# Patient Record
Sex: Female | Born: 1975 | Race: Black or African American | Hispanic: No | Marital: Single | Smoking: Current every day smoker
Health system: Southern US, Community
[De-identification: ages and names within clinical notes are randomized; demographics above are authoritative.]

## PROBLEM LIST (undated history)

## (undated) HISTORY — PX: COSMETIC SURGERY: SHX468

## (undated) HISTORY — PX: SHOULDER SURGERY: SHX246

## (undated) HISTORY — PX: OTHER SURGICAL HISTORY: SHX169

## (undated) HISTORY — PX: FACIAL FRACTURE SURGERY: SHX1570

---

## 2001-06-23 ENCOUNTER — Emergency Department (HOSPITAL_COMMUNITY): Admission: EM | Admit: 2001-06-23 | Discharge: 2001-06-23 | Payer: Self-pay | Admitting: Emergency Medicine

## 2001-06-23 ENCOUNTER — Encounter: Payer: Self-pay | Admitting: Emergency Medicine

## 2001-07-04 ENCOUNTER — Emergency Department (HOSPITAL_COMMUNITY): Admission: EM | Admit: 2001-07-04 | Discharge: 2001-07-04 | Payer: Self-pay | Admitting: Emergency Medicine

## 2001-07-09 ENCOUNTER — Emergency Department (HOSPITAL_COMMUNITY): Admission: EM | Admit: 2001-07-09 | Discharge: 2001-07-09 | Payer: Self-pay | Admitting: Emergency Medicine

## 2001-07-27 ENCOUNTER — Emergency Department (HOSPITAL_COMMUNITY): Admission: EM | Admit: 2001-07-27 | Discharge: 2001-07-27 | Payer: Self-pay | Admitting: *Deleted

## 2001-11-29 ENCOUNTER — Other Ambulatory Visit: Admission: RE | Admit: 2001-11-29 | Discharge: 2001-11-29 | Payer: Self-pay | Admitting: Obstetrics and Gynecology

## 2002-03-19 ENCOUNTER — Inpatient Hospital Stay (HOSPITAL_COMMUNITY): Admission: AD | Admit: 2002-03-19 | Discharge: 2002-03-22 | Payer: Self-pay | Admitting: Obstetrics and Gynecology

## 2002-04-16 ENCOUNTER — Other Ambulatory Visit: Admission: RE | Admit: 2002-04-16 | Discharge: 2002-04-16 | Payer: Self-pay | Admitting: Obstetrics and Gynecology

## 2003-01-16 ENCOUNTER — Emergency Department (HOSPITAL_COMMUNITY): Admission: EM | Admit: 2003-01-16 | Discharge: 2003-01-16 | Payer: Self-pay | Admitting: Emergency Medicine

## 2003-01-20 ENCOUNTER — Inpatient Hospital Stay (HOSPITAL_COMMUNITY): Admission: AD | Admit: 2003-01-20 | Discharge: 2003-01-20 | Payer: Self-pay | Admitting: Obstetrics and Gynecology

## 2004-08-14 ENCOUNTER — Inpatient Hospital Stay (HOSPITAL_COMMUNITY): Admission: AD | Admit: 2004-08-14 | Discharge: 2004-08-14 | Payer: Self-pay | Admitting: *Deleted

## 2006-04-07 ENCOUNTER — Inpatient Hospital Stay (HOSPITAL_COMMUNITY): Admission: AD | Admit: 2006-04-07 | Discharge: 2006-04-07 | Payer: Self-pay | Admitting: Obstetrics & Gynecology

## 2006-11-11 ENCOUNTER — Emergency Department (HOSPITAL_COMMUNITY): Admission: EM | Admit: 2006-11-11 | Discharge: 2006-11-12 | Payer: Self-pay | Admitting: Emergency Medicine

## 2006-11-17 ENCOUNTER — Emergency Department (HOSPITAL_COMMUNITY): Admission: EM | Admit: 2006-11-17 | Discharge: 2006-11-17 | Payer: Self-pay | Admitting: Family Medicine

## 2007-05-01 IMAGING — CT CT ABDOMEN W/O CM
3 series · 16 of 36 positions shown, 19 images · IV contrast (agent unspecified)
Comparison: Abdominal ultrasound on the same date.

CLINICAL DATA: Right sided abdominal pain and back pain.  Negative abdominal ultrasound. 
 ABDOMEN CT WITHOUT CONTRAST:
TECHNIQUE: Multidetector CT imaging of the abdomen was performed following the standard protocol without IV contrast.
TECHNIQUE: Multidetector CT imaging of the pelvis was performed following the standard protocol without IV contrast.

[Series 2: renal stone · axial · 0.76mm/px · z∈[-446,-86]mm · 14 of 81 slices shown, 17 images]
[im 6/81  soft-tissue]
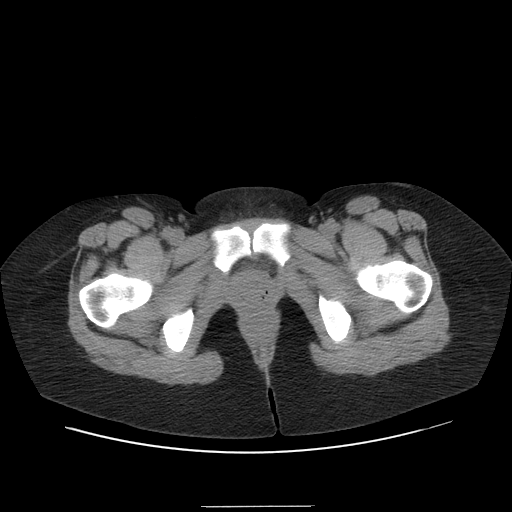
[im 6/81  bone]
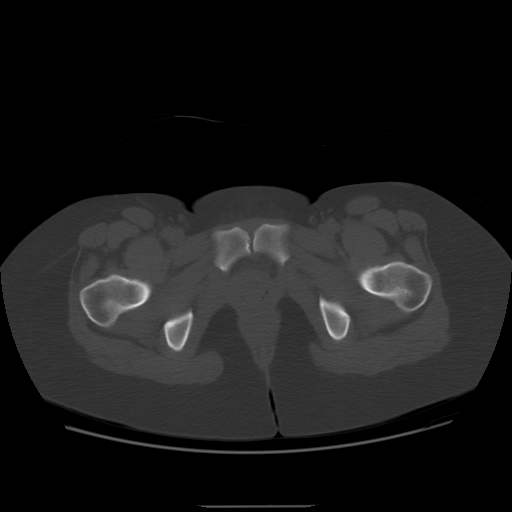
[im 13/81  soft-tissue]
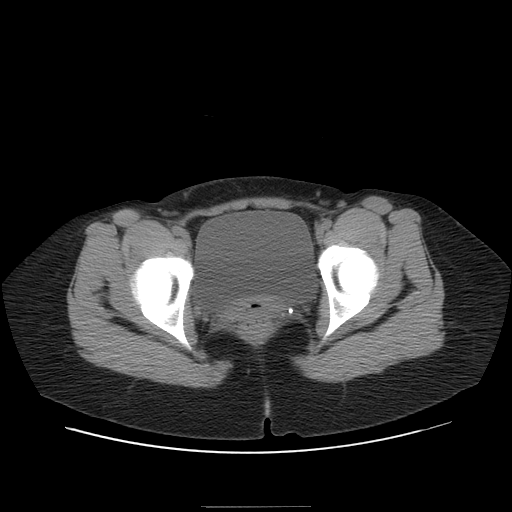
[im 19/81  soft-tissue]
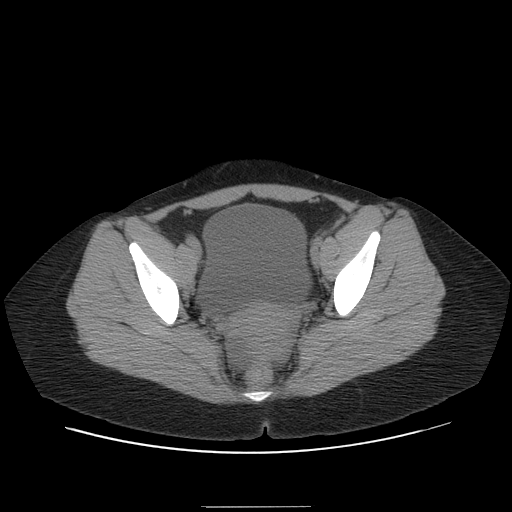
[im 26/81  soft-tissue]
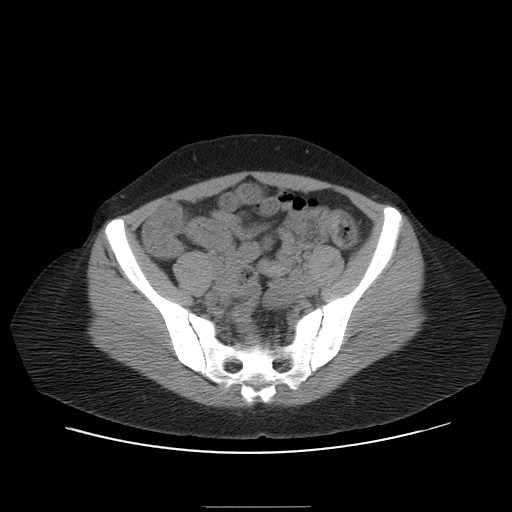
[im 34/81  soft-tissue]
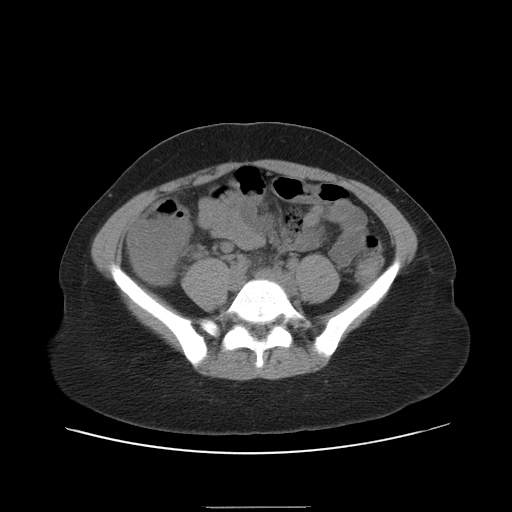
[im 42/81  soft-tissue]
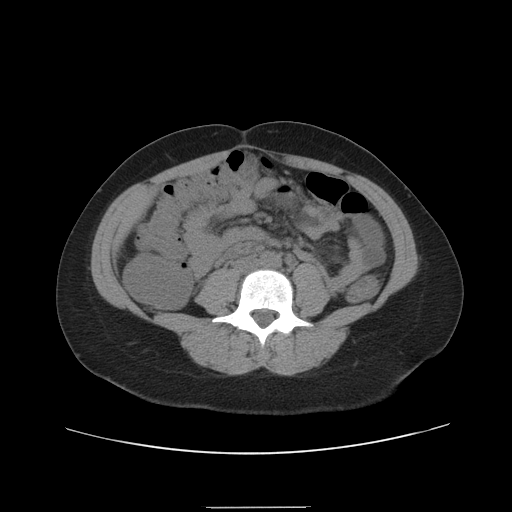
[im 47/81  soft-tissue]
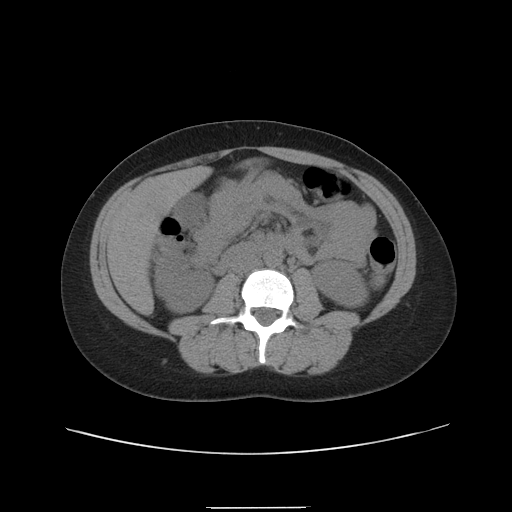
[im 55/81  soft-tissue]
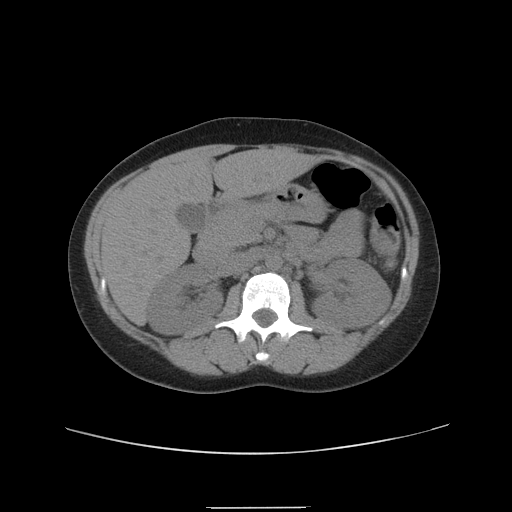
[im 62/81  soft-tissue]
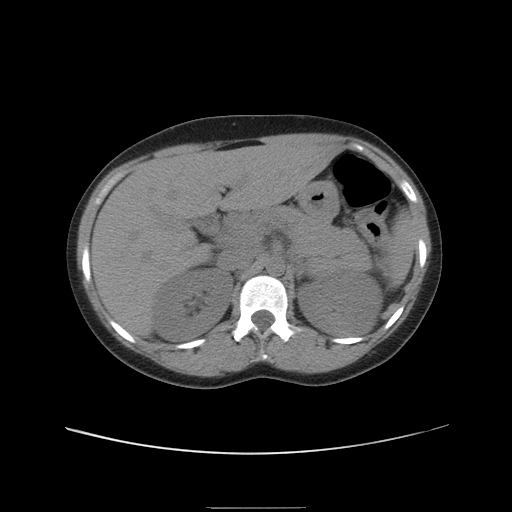
[im 62/81  bone]
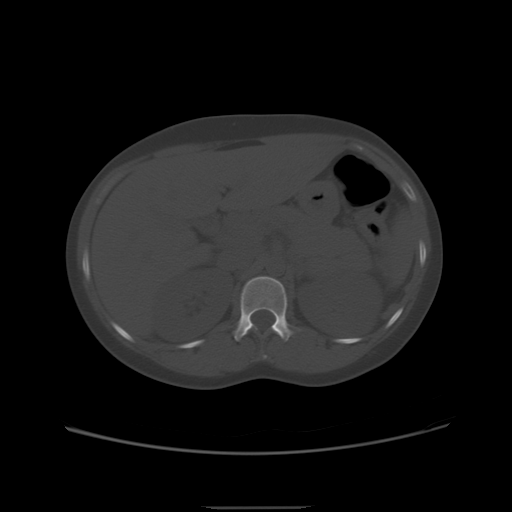
[im 68/81  soft-tissue]
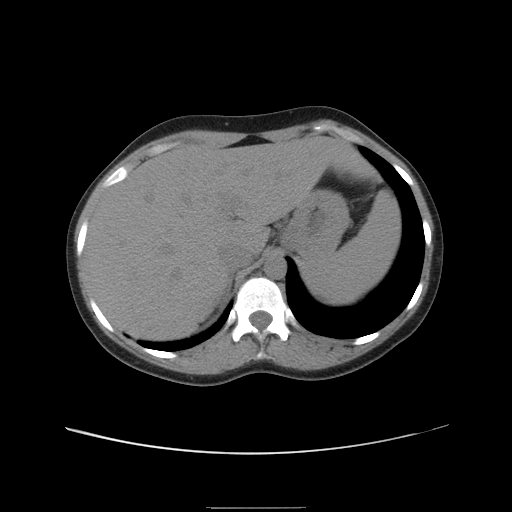
[im 70/81  lung]
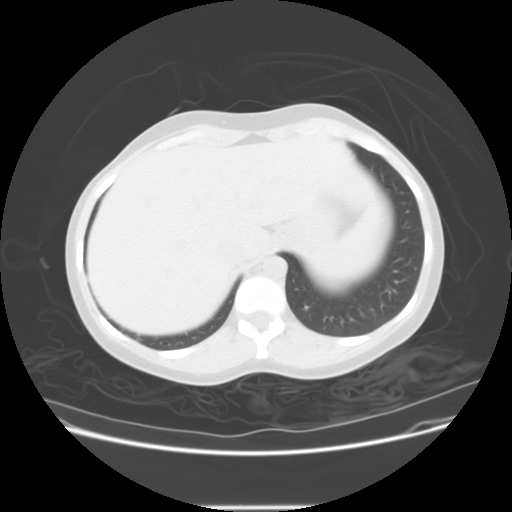
[im 73/81  lung]
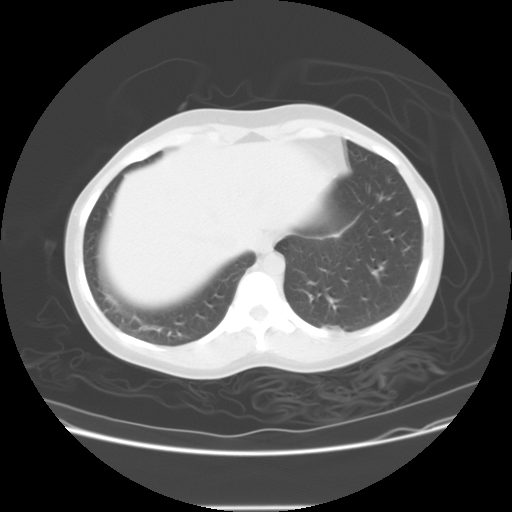
[im 75/81  soft-tissue]
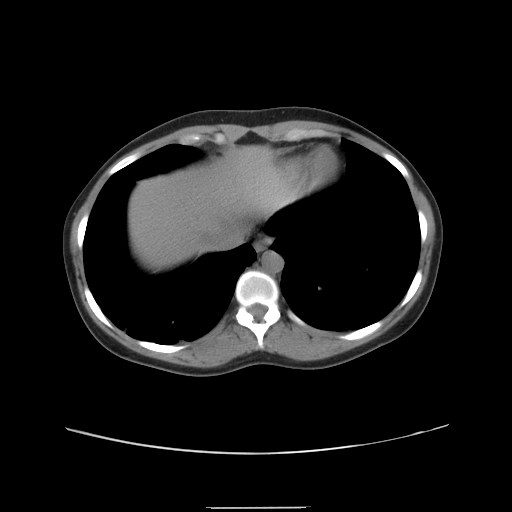
[im 75/81  lung]
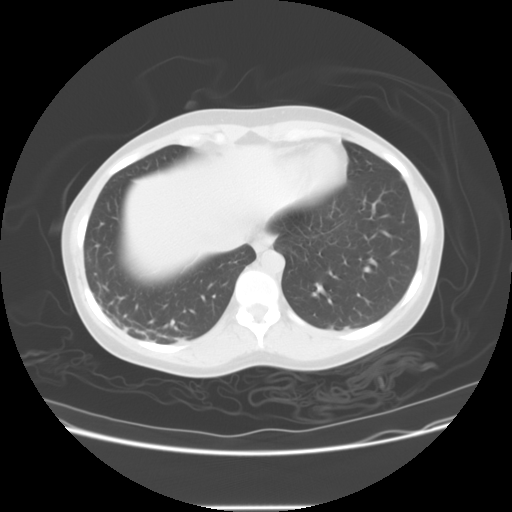
[im 78/81  lung]
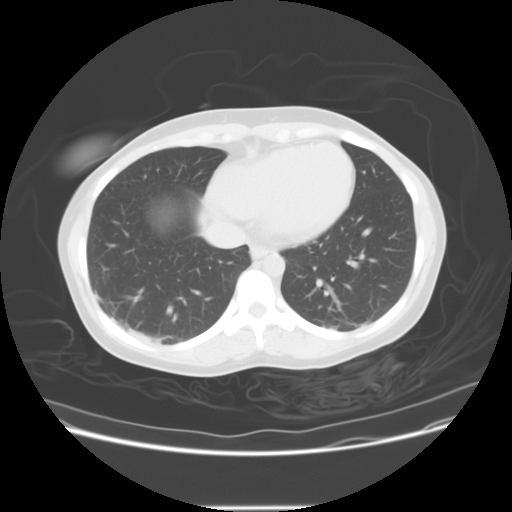

[Series 300: cor abd · coronal · 0.82mm/px · 1 of 90 slices shown]
[im 2/90  soft-tissue]
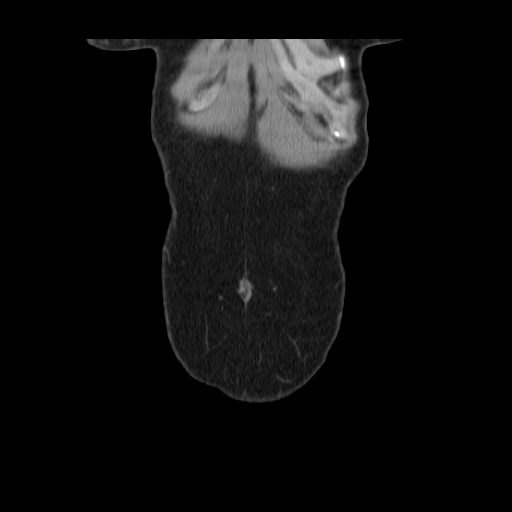

[Series 301: sag abd · sagittal · 0.82mm/px · 1 of 136 slices shown]
[im 68/136  soft-tissue]
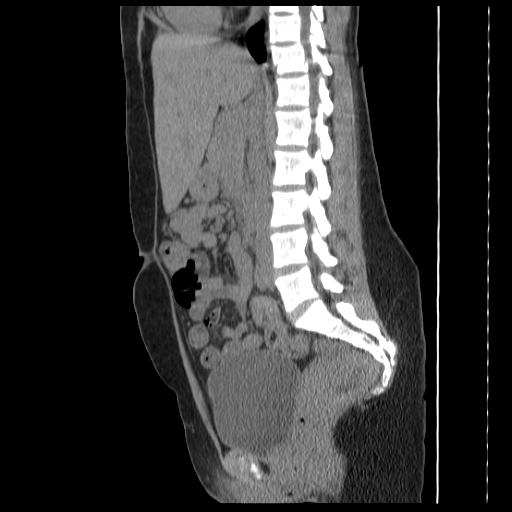

[16 of 36 positions shown; findings below may reference images not displayed]

FINDINGS: Minimal atelectasis present at the lung bases right greater than left.  No evidence of renal calculi, ureteral calculi, or obstruction in the abdomen.  The unenhanced solid organs are unremarkable.  There is some nonspecific fluid present in the colon and small bowel without overt bowel obstruction.  Fairly diffuse diverticula also noted in the right and transverse colons without evidence of surrounding inflammation to suggest definite acute diverticulitis.  No abscess, free fluid, or free air.
IMPRESSION: No definite acute findings in the abdomen.  Diverticulosis of the right and transverse colon without definite diverticulitis or bowel obstruction. 
 PELVIS CT WITHOUT CONTRAST:
FINDINGS: Small amount of free fluid is present in the cul-de-sac.  No ureteral or bladder calculi.  No hernias.  Uterus and adnexal regions are unremarkable by CT.  Pelvic bowel loops show no dilatation or inflammatory findings.
IMPRESSION: Small amount of free fluid in the cul-de-sac.  No acute inflammatory process in the pelvis.

## 2010-06-17 ENCOUNTER — Emergency Department (HOSPITAL_COMMUNITY): Admission: EM | Admit: 2010-06-17 | Discharge: 2010-06-17 | Payer: Self-pay | Admitting: Emergency Medicine

## 2010-10-10 ENCOUNTER — Emergency Department (HOSPITAL_COMMUNITY): Admission: EM | Admit: 2010-10-10 | Discharge: 2010-10-10 | Payer: Self-pay | Admitting: Emergency Medicine

## 2011-04-29 NOTE — Discharge Summary (Signed)
[facility]  Patient:    Wanda Cox, Wanda Cox Visit Number: [id number] MRN: [id number]          Service Type: OBS Location: 910A [id number] Attending Physician:  Wandalee Ferdinand Dictated by:   Rudy Jew Ashley Royalty, M.D. Admit Date:  03/19/2002 Discharge Date: 03/22/2002                             Discharge Summary  DISCHARGE DIAGNOSES:          1. Intrauterine pregnancy at 37 weeks,                                  delivered.                               2. Twin gestation--vertex/vertex.                               3. Intrauterine growth restriction--Twin A.                               4. Late presentation for care.                               5. Term birth, twin gestation, vertex/vertex.  OPERATIONS AND SPECIAL PROCEDURES:           OB delivery x 2.  CONSULTATIONS:                None.  DISCHARGE MEDICATIONS:        Chromagen.  HISTORY AND PHYSICAL:         This is a 35 year old, gravida 2, para 1, at [redacted] weeks gestation. Prenatal care was complicated by the aforementioned risk factors. Twin A was noted to have IUGR. Recent fetal surveillance was reassuring. The patient was admitted for induction secondary to IUGR of Twin A and favorable cervix with vertex/vertex presentation.  HOSPITAL COURSE:              The patient was admitted to [facility] [place]. Admission laboratory studies were drawn. On March 19, 2002 her labor was induced. She went on to deliver on March 20, 2002 at 4:43 a.m. Twin A was a 106-gram female with Apgars 3 at one minute, 8 at five minutes, sent to the neonatal intensive care unit. Team was present at approximately one minute after delivery. Arterial cord pH was 7.34. The umbilical cord was noted to be of quite narrow caliber. The second twin was born approximately 5:11 a.m. The second twin was a 5-pound 2-ounce female with Apgars 8 at one minute and 9 and five minutes, sent to the central nursery.  Arterial cord pH of the second twin was 7.31. Placenta and membranes were removed in their entirety and submitted intact, pathology for histologic studies.  The patients postpartum course was benign and she was discharged on the second postpartum day, afebrile and in satisfactory condition.  ACCESSORY CLINICAL FINDINGS:  Hemoglobin and hematocrit on admission were 9.9 and 30.2, respectively. Repeat values obtained on March 21, 2002 were 8.0 and 23.7, respectively. At the time of this dictation, I see no pathology report  from the placenta and membranes on the chart. This will be requested.  DISPOSITION:                  The patient is to return to [facility] in four to six weeks for postpartum evaluation. Dictated by:   Rudy Jew Ashley Royalty, M.D. Attending Physician:  Wandalee Ferdinand DD:  04/08/02 TD:  04/09/02 Job: [id number]

## 2013-04-12 ENCOUNTER — Encounter (HOSPITAL_COMMUNITY): Payer: Self-pay | Admitting: *Deleted

## 2013-04-12 ENCOUNTER — Emergency Department (HOSPITAL_COMMUNITY)
Admission: EM | Admit: 2013-04-12 | Discharge: 2013-04-12 | Disposition: A | Discharge status: Home / Self Care | Payer: BC Managed Care – PPO | Attending: Emergency Medicine | Admitting: Emergency Medicine

## 2013-04-12 DIAGNOSIS — L03019 Cellulitis of unspecified finger: Secondary | ICD-10-CM | POA: Insufficient documentation

## 2013-04-12 DIAGNOSIS — R209 Unspecified disturbances of skin sensation: Secondary | ICD-10-CM | POA: Insufficient documentation

## 2013-04-12 DIAGNOSIS — L03011 Cellulitis of right finger: Secondary | ICD-10-CM

## 2013-04-12 MED ORDER — SULFAMETHOXAZOLE-TRIMETHOPRIM 800-160 MG PO TABS: 1.0000 | ORAL_TABLET | Freq: Two times a day (BID) | ORAL | Status: AC

## 2013-04-12 MED ORDER — HYDROCODONE-ACETAMINOPHEN 5-325 MG PO TABS
2.0000 | ORAL_TABLET | Freq: Once | ORAL | Status: AC
  Administered 2013-04-12: 2 via ORAL
  Filled 2013-04-12: qty 2

## 2013-04-12 MED ORDER — HYDROCODONE-ACETAMINOPHEN 5-325 MG PO TABS: 1.0000 | ORAL_TABLET | Freq: Four times a day (QID) | ORAL | Status: DC | PRN

## 2013-04-12 NOTE — ED Provider Notes (Signed)
History     CSN: [id number]  Arrival date & time 04/12/13  0348   None     Chief Complaint  Patient presents with  . Joint Swelling    (Consider location/radiation/quality/duration/timing/severity/associated sxs/prior treatment) HPI Comments: Patient presents for swelling to her distal R index finger with onset yesterday. Patient admits to associated redness as well as a throbbing, nonradiating pain that is constant. Also endorses intermittent tingling sensation. Worse with palpation and improved with rest. Patient denies trying anything PO to help the pain. Denies fevers, drainage, numbness, and R hand/RUE weakness. Denies recent trauma or injury.  The history is provided by the patient. No language interpreter was used.    History reviewed. No pertinent past medical history.  History reviewed. No pertinent past surgical history.  No family history on file.  History  Substance Use Topics  . Smoking status: Never Smoker   . Smokeless tobacco: Not on file  . Alcohol Use: No    OB History   Grav Para Term Preterm Abortions TAB SAB Ect Mult Living                  Review of Systems  Constitutional: Negative for fever.  Musculoskeletal:       +finger swelling  Skin: Positive for color change.  Neurological: Negative for weakness and numbness.  All other systems reviewed and are negative.    Allergies  Review of patient's allergies indicates no known allergies.  Home Medications   Current Outpatient Rx  Name  Route  Sig  Dispense  Refill  . HYDROcodone-acetaminophen (NORCO/VICODIN) 5-325 MG per tablet   Oral   Take 1 tablet by mouth every 6 (six) hours as needed for pain.   6 tablet   0   . sulfamethoxazole-trimethoprim (BACTRIM DS,SEPTRA DS) 800-160 MG per tablet   Oral   Take 1 tablet by mouth 2 (two) times daily.   14 tablet   0     BP 113/84  Pulse 64  Temp(Src) 98.1 F (36.7 C) (Oral)  SpO2 100%  LMP 04/10/2013  Physical Exam  Nursing note  and vitals reviewed. Constitutional: She is oriented to person, place, and time. She appears well-developed and well-nourished.  HENT:  Head: Normocephalic and atraumatic.  Right Ear: External ear normal.  Left Ear: External ear normal.  Eyes: Conjunctivae and EOM are normal. No scleral icterus.  Neck: Normal range of motion.  Cardiovascular: Normal rate, regular rhythm and intact distal pulses.   Pulmonary/Chest: Effort normal. No respiratory distress.  Musculoskeletal: Normal range of motion. She exhibits tenderness.       Right hand: She exhibits tenderness and swelling. She exhibits normal range of motion, no bony tenderness, normal two-point discrimination, normal capillary refill and no deformity. Normal sensation noted. Normal strength noted. She exhibits no finger abduction and no thumb/finger opposition.       Hands: Erythema and swelling around nail bed of R index finger without decreased ROM or active drainage.  Neurovascularly intact.  Neurological: She is alert and oriented to person, place, and time. She has normal strength. No sensory deficit.  Skin: Skin is warm and dry. No rash noted. There is erythema. No pallor.  +mild erythema of skin surrounding nailbed of R index finger with mild heat-to-touch  Psychiatric: She has a normal mood and affect. Her behavior is normal.    ED Course  Procedures (including critical care time)  Labs Reviewed - No data to display No results found.   1.  Paronychia, right     MDM  Uncomplicated paronychia x 2 days. Patient afebrile and neurovascularly intact without pallor, pulselessness, paresthesias, or poikilothermia. Appropriate for d/c with PCP follow up, Bactrim for infection, and Norco to take as needed for severe pain. Indications for ED return discussed. Patient states comfort and understanding with this d/c plan with no unaddressed concerns.   Filed Vitals:   04/12/13 0357  BP: 113/84  Pulse: 64  Temp: 98.1 F (36.7 C)    TempSrc: Oral  SpO2: 100%           Antony Madura, PA-C 04/15/13 [phone]

## 2013-04-12 NOTE — ED Notes (Signed)
Rt. Index finger mildly swollen. Doesn't know why? It is. Feels numb, cap refill wnl.

## 2013-04-15 NOTE — ED Provider Notes (Signed)
Medical screening examination/treatment/procedure(s) were performed by non-physician practitioner and as supervising physician I was immediately available for consultation/collaboration.   Desera Graffeo L Tuff Clabo, MD 04/15/13 1919 

## 2015-04-17 ENCOUNTER — Emergency Department (HOSPITAL_COMMUNITY)
Admission: EM | Admit: 2015-04-17 | Discharge: 2015-04-17 | Disposition: A | Discharge status: Home / Self Care | Payer: Medicaid Other | Attending: Emergency Medicine | Admitting: Emergency Medicine

## 2015-04-17 ENCOUNTER — Encounter (HOSPITAL_COMMUNITY): Payer: Self-pay | Admitting: Emergency Medicine

## 2015-04-17 DIAGNOSIS — M62830 Muscle spasm of back: Secondary | ICD-10-CM | POA: Diagnosis not present

## 2015-04-17 DIAGNOSIS — Z720 Tobacco use: Secondary | ICD-10-CM | POA: Diagnosis not present

## 2015-04-17 DIAGNOSIS — Z3202 Encounter for pregnancy test, result negative: Secondary | ICD-10-CM | POA: Diagnosis not present

## 2015-04-17 DIAGNOSIS — M545 Low back pain: Secondary | ICD-10-CM | POA: Diagnosis present

## 2015-04-17 LAB — URINALYSIS, ROUTINE W REFLEX MICROSCOPIC
Bilirubin Urine: NEGATIVE
GLUCOSE, UA: NEGATIVE mg/dL
Hgb urine dipstick: NEGATIVE
KETONES UR: NEGATIVE mg/dL
LEUKOCYTES UA: NEGATIVE
NITRITE: NEGATIVE
PH: 8 (ref 5.0–8.0)
Protein, ur: NEGATIVE mg/dL
SPECIFIC GRAVITY, URINE: 1.023 (ref 1.005–1.030)
UROBILINOGEN UA: 0.2 mg/dL (ref 0.0–1.0)

## 2015-04-17 LAB — CBC WITH DIFFERENTIAL/PLATELET
BASOS ABS: 0 10*3/uL (ref 0.0–0.1)
BASOS PCT: 0 % (ref 0–1)
Eosinophils Absolute: 0.2 10*3/uL (ref 0.0–0.7)
Eosinophils Relative: 4 % (ref 0–5)
HEMATOCRIT: 36.6 % (ref 36.0–46.0)
HEMOGLOBIN: 11.8 g/dL — AB (ref 12.0–15.0)
Lymphocytes Relative: 43 % (ref 12–46)
Lymphs Abs: 2.4 10*3/uL (ref 0.7–4.0)
MCH: 27.4 pg (ref 26.0–34.0)
MCHC: 32.2 g/dL (ref 30.0–36.0)
MCV: 84.9 fL (ref 78.0–100.0)
MONOS PCT: 9 % (ref 3–12)
Monocytes Absolute: 0.5 10*3/uL (ref 0.1–1.0)
NEUTROS ABS: 2.4 10*3/uL (ref 1.7–7.7)
NEUTROS PCT: 44 % (ref 43–77)
Platelets: 244 10*3/uL (ref 150–400)
RBC: 4.31 MIL/uL (ref 3.87–5.11)
RDW: 14.4 % (ref 11.5–15.5)
WBC: 5.5 10*3/uL (ref 4.0–10.5)

## 2015-04-17 LAB — COMPREHENSIVE METABOLIC PANEL
ALBUMIN: 3.5 g/dL (ref 3.5–5.0)
ALK PHOS: 61 U/L (ref 38–126)
ALT: 13 U/L — ABNORMAL LOW (ref 14–54)
AST: 16 U/L (ref 15–41)
Anion gap: 8 (ref 5–15)
BILIRUBIN TOTAL: 0.4 mg/dL (ref 0.3–1.2)
BUN: 13 mg/dL (ref 6–20)
CHLORIDE: 107 mmol/L (ref 101–111)
CO2: 22 mmol/L (ref 22–32)
CREATININE: 0.95 mg/dL (ref 0.44–1.00)
Calcium: 9 mg/dL (ref 8.9–10.3)
GFR calc Af Amer: >60 mL/min (ref >60)
Glucose, Bld: 98 mg/dL (ref 70–99)
POTASSIUM: 4 mmol/L (ref 3.5–5.1)
Sodium: 137 mmol/L (ref 135–145)
Total Protein: 6.4 g/dL — ABNORMAL LOW (ref 6.5–8.1)

## 2015-04-17 LAB — LIPASE, BLOOD: LIPASE: 26 U/L (ref 22–51)

## 2015-04-17 LAB — POC URINE PREG, ED: PREG TEST UR: NEGATIVE

## 2015-04-17 MED ORDER — LIDOCAINE 5 % EX PTCH: 1.0000 | MEDICATED_PATCH | CUTANEOUS | Status: DC

## 2015-04-17 MED ORDER — CYCLOBENZAPRINE HCL 5 MG PO TABS: 5.0000 mg | ORAL_TABLET | Freq: Three times a day (TID) | ORAL | Status: DC | PRN

## 2015-04-17 MED ORDER — NAPROXEN 500 MG PO TABS: 500.0000 mg | ORAL_TABLET | Freq: Two times a day (BID) | ORAL | Status: DC

## 2015-04-17 MED ORDER — TRAMADOL HCL 50 MG PO TABS
50.0000 mg | ORAL_TABLET | Freq: Four times a day (QID) | ORAL | Status: DC | PRN
Start: 2015-04-17 — End: 2016-08-25
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
## 2015-04-17 NOTE — ED Notes (Signed)
C/o R lower back/flank pain that radiates to RLQ and down R leg since noon today.  Denies urinary symptoms.  Also reports nausea.

## 2015-04-17 NOTE — ED Notes (Signed)
T. Left with all belongings and refused wheelchair

## 2015-04-17 NOTE — Discharge Instructions (Signed)
SEEK IMMEDIATE MEDICAL ATTENTION IF: New numbness, tingling, weakness, or problem with the use of your arms or legs.  Severe back pain not relieved with medications.  Change in bowel or bladder control.  Increasing pain in any areas of the body (such as chest or abdominal pain).  Shortness of breath, dizziness or fainting.  Nausea (feeling sick to your stomach), vomiting, fever, or sweats.   Heat Therapy Heat therapy can help ease sore, stiff, injured, and tight muscles and joints. Heat relaxes your muscles, which may help ease your pain.  RISKS AND COMPLICATIONS If you have any of the following conditions, do not use heat therapy unless your health care provider has approved:  Poor circulation.  Healing wounds or scarred skin in the area being treated.  Diabetes, heart disease, or high blood pressure.  Not being able to feel (numbness) the area being treated.  Unusual swelling of the area being treated.  Active infections.  Blood clots.  Cancer.  Inability to communicate pain. This may include young children and people who have problems with their brain function (dementia).  Pregnancy. Heat therapy should only be used on old, pre-existing, or long-lasting (chronic) injuries. Do not use heat therapy on new injuries unless directed by your health care provider. HOW TO USE HEAT THERAPY There are several different kinds of heat therapy, including:  Moist heat pack.  Warm water bath.  Hot water bottle.  Electric heating pad.  Heated gel pack.  Heated wrap.  Electric heating pad. Use the heat therapy method suggested by your health care provider. Follow your health care provider's instructions on when and how to use heat therapy. GENERAL HEAT THERAPY RECOMMENDATIONS  Do not sleep while using heat therapy. Only use heat therapy while you are awake.  Your skin may turn pink while using heat therapy. Do not use heat therapy if your skin turns red.  Do not use heat  therapy if you have new pain.  High heat or long exposure to heat can cause burns. Be careful when using heat therapy to avoid burning your skin.  Do not use heat therapy on areas of your skin that are already irritated, such as with a rash or sunburn. SEEK MEDICAL CARE IF:  You have blisters, redness, swelling, or numbness.  You have new pain.  Your pain is worse. MAKE SURE YOU:  Understand these instructions.  Will watch your condition.  Will get help right away if you are not doing well or get worse. Document Released: 02/20/2012 Document Revised: 04/14/2014 Document Reviewed: 01/21/2014 [facility] Patient Information 2015 [place], [place]. This information is not intended to replace advice given to you by your health care provider. Make sure you discuss any questions you have with your health care provider.  Musculoskeletal Pain Musculoskeletal pain is muscle and boney aches and pains. These pains can occur in any part of the body. Your caregiver may treat you without knowing the cause of the pain. They may treat you if blood or urine tests, X-rays, and other tests were normal.  CAUSES There is often not a definite cause or reason for these pains. These pains may be caused by a type of germ (virus). The discomfort may also come from overuse. Overuse includes working out too hard when your body is not fit. Boney aches also come from weather changes. Bone is sensitive to atmospheric pressure changes. HOME CARE INSTRUCTIONS   Ask when your test results will be ready. Make sure you get your test results.  Only take over-the-counter or prescription medicines for pain, discomfort, or fever as directed by your caregiver. If you were given medications for your condition, do not drive, operate machinery or power tools, or sign legal documents for 24 hours. Do not drink alcohol. Do not take sleeping pills or other medications that may interfere with treatment.  Continue all activities unless  the activities cause more pain. When the pain lessens, slowly resume normal activities. Gradually increase the intensity and duration of the activities or exercise.  During periods of severe pain, bed rest may be helpful. Lay or sit in any position that is comfortable.  Putting ice on the injured area.  Put ice in a bag.  Place a towel between your skin and the bag.  Leave the ice on for 15 to 20 minutes, 3 to 4 times a day.  Follow up with your caregiver for continued problems and no reason can be found for the pain. If the pain becomes worse or does not go away, it may be necessary to repeat tests or do additional testing. Your caregiver may need to look further for a possible cause. SEEK IMMEDIATE MEDICAL CARE IF:  You have pain that is getting worse and is not relieved by medications.  You develop chest pain that is associated with shortness or breath, sweating, feeling sick to your stomach (nauseous), or throw up (vomit).  Your pain becomes localized to the abdomen.  You develop any new symptoms that seem different or that concern you. MAKE SURE YOU:   Understand these instructions.  Will watch your condition.  Will get help right away if you are not doing well or get worse. Document Released: 11/28/2005 Document Revised: 02/20/2012 Document Reviewed: 08/02/2013 [facility] Patient Information 2015 [employer] [place], [place]. This information is not intended to replace advice given to you by your health care provider. Make sure you discuss any questions you have with your health care provider.  Trigger Point Injection Trigger points are areas where you have muscle pain. A trigger point injection is a shot given in the trigger point to relieve that pain. A trigger point might feel like a knot in your muscle. It hurts to press on a trigger point. Sometimes the pain spreads out (radiates) to other parts of the body. For example, pressing on a trigger point in your shoulder might cause pain in  your arm or neck. You might have one trigger point. Or, you might have more than one. People often have trigger points in their upper back and lower back. They also occur often in the neck and shoulders. Pain from a trigger point lasts for a long time. It can make it hard to keep moving. You might not be able to do the exercise or physical therapy that could help you deal with the pain. A trigger point injection may help. It does not work for everyone. But, it may relieve your pain for a few days or a few months. A trigger point injection does not cure long-lasting (chronic) pain. LET YOUR CAREGIVER KNOW ABOUT:  Any allergies (especially to latex, lidocaine, or steroids).  Blood-thinning medicines that you take. These drugs can lead to bleeding or bruising after an injection. They include:  Aspirin.  Ibuprofen.  Clopidogrel.  Warfarin.  Other medicines you take. This includes all vitamins, herbs, eyedrops, over-the-counter medicines, and creams.  Use of steroids.  Recent infections.  Past problems with numbing medicines.  Bleeding problems.  Surgeries you have had.  Other health problems. RISKS AND  COMPLICATIONS A trigger point injection is a safe treatment. However, problems may develop, such as:  Minor side effects usually go away in 1 to 2 days. These may include:  Soreness.  Bruising.  Stiffness.  More serious problems are rare. But, they may include:  Bleeding under the skin (hematoma).  Skin infection.  Breaking off of the needle under your skin.  Lung puncture.  The trigger point injection may not work for you. BEFORE THE PROCEDURE You may need to stop taking any medicine that thins your blood. This is to prevent bleeding and bruising. Usually these medicines are stopped several days before the injection. No other preparation is needed. PROCEDURE  A trigger point injection can be given in your caregiver's office or in a clinic. Each injection takes 2  minutes or less.  Your caregiver will feel for trigger points. The caregiver may use a marker to circle the area for the injection.  The skin over the trigger point will be washed with a germ-killing (antiseptic) solution.  The caregiver pinches the spot for the injection.  Then, a very thin needle is used for the shot. You may feel pain or a twitching feeling when the needle enters the trigger point.  A numbing solution may be injected into the trigger point. Sometimes a drug to keep down swelling, redness, and warmth (inflammation) is also injected.  Your caregiver moves the needle around the trigger zone until the tightness and twitching goes away.  After the injection, your caregiver may put gentle pressure over the injection site.  Then it is covered with a bandage. AFTER THE PROCEDURE  You can go right home after the injection.  The bandage can be taken off after a few hours.  You may feel sore and stiff for 1 to 2 days.  Go back to your regular activities slowly. Your caregiver may ask you to stretch your muscles. Do not do anything that takes extra energy for a few days.  Follow your caregiver's instructions to manage and treat other pain. Document Released: 11/17/2011 Document Revised: 03/25/2013 Document Reviewed: 11/17/2011 [facility] Patient Information 2015 [place], [place]. This information is not intended to replace advice given to you by your health care provider. Make sure you discuss any questions you have with your health care provider.

## 2016-05-16 ENCOUNTER — Encounter (HOSPITAL_COMMUNITY): Payer: Self-pay | Admitting: *Deleted

## 2016-05-16 ENCOUNTER — Ambulatory Visit (HOSPITAL_COMMUNITY)
Admission: EM | Admit: 2016-05-16 | Discharge: 2016-05-16 | Disposition: A | Discharge status: Home / Self Care | Payer: Medicaid Other | Attending: Family Medicine | Admitting: Family Medicine

## 2016-05-16 DIAGNOSIS — J020 Streptococcal pharyngitis: Secondary | ICD-10-CM | POA: Diagnosis not present

## 2016-05-16 DIAGNOSIS — J029 Acute pharyngitis, unspecified: Secondary | ICD-10-CM | POA: Diagnosis present

## 2016-05-16 LAB — POCT RAPID STREP A: STREPTOCOCCUS, GROUP A SCREEN (DIRECT): NEGATIVE

## 2016-05-16 MED ORDER — AMOXICILLIN 500 MG PO CAPS: 500.0000 mg | ORAL_CAPSULE | Freq: Three times a day (TID) | ORAL | Status: DC
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
## 2016-05-16 NOTE — ED Notes (Signed)
Pt  Reports   Symptoms  Of  sorethroat     With     Pain  When  She  Swallows        X  4  Days      Pt  Has   An

## 2016-05-19 LAB — CULTURE, GROUP A STREP (THRC)

## 2016-07-17 ENCOUNTER — Emergency Department (HOSPITAL_COMMUNITY)
Admission: EM | Admit: 2016-07-17 | Discharge: 2016-07-17 | Disposition: A | Discharge status: Home / Self Care | Payer: Medicaid Other | Attending: Emergency Medicine | Admitting: Emergency Medicine

## 2016-07-17 ENCOUNTER — Emergency Department (HOSPITAL_COMMUNITY): Payer: Medicaid Other

## 2016-07-17 ENCOUNTER — Encounter (HOSPITAL_COMMUNITY): Payer: Self-pay | Admitting: *Deleted

## 2016-07-17 DIAGNOSIS — Y939 Activity, unspecified: Secondary | ICD-10-CM | POA: Insufficient documentation

## 2016-07-17 DIAGNOSIS — Y999 Unspecified external cause status: Secondary | ICD-10-CM | POA: Diagnosis not present

## 2016-07-17 DIAGNOSIS — M545 Low back pain, unspecified: Secondary | ICD-10-CM

## 2016-07-17 DIAGNOSIS — S0083XA Contusion of other part of head, initial encounter: Secondary | ICD-10-CM | POA: Insufficient documentation

## 2016-07-17 DIAGNOSIS — Z79899 Other long term (current) drug therapy: Secondary | ICD-10-CM | POA: Insufficient documentation

## 2016-07-17 DIAGNOSIS — F17200 Nicotine dependence, unspecified, uncomplicated: Secondary | ICD-10-CM | POA: Insufficient documentation

## 2016-07-17 DIAGNOSIS — Y92410 Unspecified street and highway as the place of occurrence of the external cause: Secondary | ICD-10-CM | POA: Insufficient documentation

## 2016-07-17 DIAGNOSIS — S0990XA Unspecified injury of head, initial encounter: Secondary | ICD-10-CM | POA: Diagnosis present

## 2016-07-17 LAB — RAPID URINE DRUG SCREEN, HOSP PERFORMED
Amphetamines: NOT DETECTED
BARBITURATES: NOT DETECTED
Benzodiazepines: NOT DETECTED
COCAINE: POSITIVE — AB
Opiates: NOT DETECTED
TETRAHYDROCANNABINOL: POSITIVE — AB

## 2016-07-17 LAB — URINALYSIS, ROUTINE W REFLEX MICROSCOPIC
BILIRUBIN URINE: NEGATIVE
GLUCOSE, UA: NEGATIVE mg/dL
HGB URINE DIPSTICK: NEGATIVE
KETONES UR: NEGATIVE mg/dL
Leukocytes, UA: NEGATIVE
Nitrite: NEGATIVE
PH: 5.5 (ref 5.0–8.0)
Protein, ur: NEGATIVE mg/dL
SPECIFIC GRAVITY, URINE: 1.011 (ref 1.005–1.030)

## 2016-07-17 LAB — POC URINE PREG, ED: Preg Test, Ur: NEGATIVE

## 2016-07-17 MED ORDER — ONDANSETRON HCL 4 MG/2ML IJ SOLN
4.0000 mg | Freq: Once | INTRAMUSCULAR | Status: AC
  Administered 2016-07-17: 4 mg via INTRAVENOUS
  Filled 2016-07-17: qty 2

## 2016-07-17 MED ORDER — NAPROXEN 500 MG PO TABS
500.0000 mg | ORAL_TABLET | Freq: Two times a day (BID) | ORAL | 0 refills | Status: DC
Start: 2016-07-17 — End: 2016-08-25

## 2016-07-17 MED ORDER — OXYCODONE-ACETAMINOPHEN 5-325 MG PO TABS: 1.0000 | ORAL_TABLET | Freq: Once | ORAL | Status: DC

## 2016-07-17 MED ORDER — IBUPROFEN 400 MG PO TABS
600.0000 mg | ORAL_TABLET | Freq: Once | ORAL | Status: AC
  Administered 2016-07-17: 600 mg via ORAL
  Filled 2016-07-17: qty 1

## 2016-07-17 MED ORDER — METHOCARBAMOL 500 MG PO TABS: 500.0000 mg | ORAL_TABLET | Freq: Two times a day (BID) | ORAL | 0 refills | Status: DC

## 2016-07-17 MED ORDER — HYDROCODONE-ACETAMINOPHEN 5-325 MG PO TABS: 1.0000 | ORAL_TABLET | Freq: Four times a day (QID) | ORAL | 0 refills | Status: DC | PRN

## 2016-07-17 MED ORDER — METHOCARBAMOL 500 MG PO TABS: 500.0000 mg | ORAL_TABLET | Freq: Once | ORAL | Status: DC

## 2016-07-17 MED ORDER — SODIUM CHLORIDE 0.9 % IV BOLUS (SEPSIS)
1000.0000 mL | Freq: Once | INTRAVENOUS | Status: AC
  Administered 2016-07-17: 1000 mL via INTRAVENOUS

## 2016-07-17 MED ORDER — FENTANYL CITRATE (PF) 100 MCG/2ML IJ SOLN
50.0000 ug | Freq: Once | INTRAMUSCULAR | Status: AC
  Administered 2016-07-17: 50 ug via INTRAVENOUS
  Filled 2016-07-17: qty 2

## 2016-07-17 NOTE — ED Notes (Signed)
Pt ambulated without difficulty to bathroom.
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
## 2016-07-17 NOTE — ED Notes (Signed)
Pt sleeping with snoring resp, will arouse to have bp taken, but goes back to sleep immediately,.

## 2016-08-24 ENCOUNTER — Emergency Department (HOSPITAL_COMMUNITY)
Admission: EM | Admit: 2016-08-24 | Discharge: 2016-08-25 | Disposition: A | Discharge status: Home / Self Care | Payer: Medicaid Other | Attending: Emergency Medicine | Admitting: Emergency Medicine

## 2016-08-24 ENCOUNTER — Emergency Department (HOSPITAL_COMMUNITY): Payer: Medicaid Other

## 2016-08-24 ENCOUNTER — Encounter (HOSPITAL_COMMUNITY): Payer: Self-pay | Admitting: *Deleted

## 2016-08-24 DIAGNOSIS — Y999 Unspecified external cause status: Secondary | ICD-10-CM | POA: Insufficient documentation

## 2016-08-24 DIAGNOSIS — F17200 Nicotine dependence, unspecified, uncomplicated: Secondary | ICD-10-CM | POA: Diagnosis not present

## 2016-08-24 DIAGNOSIS — S3992XA Unspecified injury of lower back, initial encounter: Secondary | ICD-10-CM | POA: Diagnosis present

## 2016-08-24 DIAGNOSIS — Y92410 Unspecified street and highway as the place of occurrence of the external cause: Secondary | ICD-10-CM | POA: Diagnosis not present

## 2016-08-24 DIAGNOSIS — S39012A Strain of muscle, fascia and tendon of lower back, initial encounter: Secondary | ICD-10-CM | POA: Diagnosis not present

## 2016-08-24 DIAGNOSIS — R0789 Other chest pain: Secondary | ICD-10-CM | POA: Diagnosis not present

## 2016-08-24 DIAGNOSIS — Y939 Activity, unspecified: Secondary | ICD-10-CM | POA: Insufficient documentation

## 2016-08-24 DIAGNOSIS — T148XXA Other injury of unspecified body region, initial encounter: Secondary | ICD-10-CM

## 2016-08-24 LAB — CBC
HCT: 44.8 % (ref 36.0–46.0)
Hemoglobin: 14.2 g/dL (ref 12.0–15.0)
MCH: 28.6 pg (ref 26.0–34.0)
MCHC: 31.7 g/dL (ref 30.0–36.0)
MCV: 90.3 fL (ref 78.0–100.0)
Platelets: 283 10*3/uL (ref 150–400)
RBC: 4.96 MIL/uL (ref 3.87–5.11)
RDW: 15 % (ref 11.5–15.5)
WBC: 9 10*3/uL (ref 4.0–10.5)

## 2016-08-24 LAB — BASIC METABOLIC PANEL
Anion gap: 7 (ref 5–15)
BUN: 6 mg/dL (ref 6–20)
CO2: 22 mmol/L (ref 22–32)
Calcium: 8.8 mg/dL — ABNORMAL LOW (ref 8.9–10.3)
Chloride: 109 mmol/L (ref 101–111)
Creatinine, Ser: 0.8 mg/dL (ref 0.44–1.00)
GFR calc Af Amer: >60 mL/min (ref >60)
GFR calc non Af Amer: >60 mL/min (ref >60)
Glucose, Bld: 88 mg/dL (ref 65–99)
Potassium: 3.9 mmol/L (ref 3.5–5.1)
Sodium: 138 mmol/L (ref 135–145)

## 2016-08-24 LAB — I-STAT TROPONIN, ED: Troponin i, poc: 0 ng/mL (ref 0.00–0.08)
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
## 2016-08-24 NOTE — ED Notes (Signed)
Patient transported to X-ray 

## 2016-08-24 NOTE — ED Notes (Signed)
Delay in lab draw,  Pt in exray 
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
## 2016-08-24 NOTE — ED Notes (Addendum)
Per PA, pt now reporting CP, SOB, R arm pain, R-sided numbness in face. Doctor, hospitalContacted Charge RN.

## 2016-08-25 ENCOUNTER — Emergency Department (HOSPITAL_COMMUNITY): Payer: Medicaid Other

## 2016-08-25 LAB — I-STAT TROPONIN, ED: Troponin i, poc: 0 ng/mL (ref 0.00–0.08)

## 2016-08-25 LAB — I-STAT BETA HCG BLOOD, ED (MC, WL, AP ONLY): I-stat hCG, quantitative: <5 m[IU]/mL (ref <5)

## 2016-08-25 MED ORDER — METHOCARBAMOL 500 MG PO TABS: 1000.0000 mg | ORAL_TABLET | Freq: Three times a day (TID) | ORAL | 0 refills | Status: DC | PRN

## 2016-08-25 MED ORDER — IOPAMIDOL (ISOVUE-370) INJECTION 76%
INTRAVENOUS | Status: AC
  Administered 2016-08-25: 100 mL
  Filled 2016-08-25: qty 100

## 2016-08-25 MED ORDER — IBUPROFEN 600 MG PO TABS: 600.0000 mg | ORAL_TABLET | Freq: Four times a day (QID) | ORAL | 0 refills | Status: DC | PRN

## 2016-08-25 MED ORDER — FENTANYL CITRATE (PF) 100 MCG/2ML IJ SOLN
50.0000 ug | Freq: Once | INTRAMUSCULAR | Status: AC
  Administered 2016-08-25: 50 ug via INTRAVENOUS
  Filled 2016-08-25: qty 2

## 2016-08-25 MED ORDER — IOPAMIDOL (ISOVUE-370) INJECTION 76%
INTRAVENOUS | Status: AC
  Filled 2016-08-25: qty 100
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
## 2016-08-25 NOTE — ED Notes (Signed)
MD at bedside. 

## 2017-10-20 ENCOUNTER — Encounter (HOSPITAL_COMMUNITY): Payer: Self-pay

## 2017-10-20 ENCOUNTER — Emergency Department (HOSPITAL_COMMUNITY)
Admission: EM | Admit: 2017-10-20 | Discharge: 2017-10-20 | Disposition: A | Discharge status: Home / Self Care | Payer: Medicaid Other | Attending: Emergency Medicine | Admitting: Emergency Medicine

## 2017-10-20 ENCOUNTER — Other Ambulatory Visit: Payer: Self-pay

## 2017-10-20 DIAGNOSIS — Y929 Unspecified place or not applicable: Secondary | ICD-10-CM | POA: Diagnosis not present

## 2017-10-20 DIAGNOSIS — Y998 Other external cause status: Secondary | ICD-10-CM | POA: Diagnosis not present

## 2017-10-20 DIAGNOSIS — S61212A Laceration without foreign body of right middle finger without damage to nail, initial encounter: Secondary | ICD-10-CM | POA: Insufficient documentation

## 2017-10-20 DIAGNOSIS — W290XXA Contact with powered kitchen appliance, initial encounter: Secondary | ICD-10-CM | POA: Diagnosis not present

## 2017-10-20 DIAGNOSIS — Z23 Encounter for immunization: Secondary | ICD-10-CM | POA: Insufficient documentation

## 2017-10-20 DIAGNOSIS — Y939 Activity, unspecified: Secondary | ICD-10-CM | POA: Insufficient documentation

## 2017-10-20 MED ORDER — TETANUS-DIPHTH-ACELL PERTUSSIS 5-2.5-18.5 LF-MCG/0.5 IM SUSP
0.5000 mL | Freq: Once | INTRAMUSCULAR | Status: AC
  Administered 2017-10-20: 0.5 mL via INTRAMUSCULAR
  Filled 2017-10-20: qty 0.5

## 2017-10-20 MED ORDER — CEPHALEXIN 500 MG PO CAPS: 500.0000 mg | ORAL_CAPSULE | Freq: Four times a day (QID) | ORAL | 0 refills | Status: DC

## 2017-10-20 NOTE — Discharge Instructions (Signed)
Please see the information and instructions below regarding your visit.  Your diagnoses today include:  1. Laceration of right middle finger without foreign body without damage to nail, initial encounter    Cellulitis is a superficial skin infection. Please take your antibiotics as prescribed for their ENTIRE prescribed duration.   Tests performed today include: See side panel of your discharge paperwork for testing performed today. Vital signs are listed at the bottom of these instructions.   Medications prescribed:    Take any prescribed medications only as prescribed, and any over the counter medications only as directed on the packaging.  1. Please take all of your antibiotics until finished.   You may develop abdominal discomfort or nausea from the antibiotic. If this occurs, you may take it with food. Some patients also get diarrhea with antibiotics. You may help offset this with probiotics which you can buy or get in yogurt. Do not eat or take the probiotics until 2 hours after your antibiotic. Some women develop vaginal yeast infections after antibiotics. If you develop unusual vaginal discharge after being on this medication, please see your primary care provider.   Some people develop allergies to antibiotics. Symptoms of antibiotic allergy can be mild and include a flat rash and itching. They can also be more serious and include:  ?Hives - Hives are raised, red patches of skin that are usually very itchy.  ?Lip or tongue swelling  ?Trouble swallowing or breathing  ?Blistering of the skin or mouth.  If you have any of these serious symptoms, please seek emergency medical care immediately.  Home care instructions:  Please follow any educational materials contained in this packet.    Keep affected area above the level of your heart when possible. Wash area gently twice a day with warm soapy water. Do not apply alcohol or hydrogen peroxide. Cover the area if it draining or  weeping.   Please wear the splint for protection until this wound is fully healed.  Please make sure you are wearing gloves while at work and working on hair.  Follow-up instructions: Please follow-up with your primary care provider or the ED in 48 hours for a check of the infection if symptoms are not improving.   Return instructions:  Please return to the Emergency Department if you experience worsening symptoms. Call your doctor sooner or return to the ER if you develop worsening signs of infection such as: increased redness, increased pain, pus, fever, or other symptoms that concern you. Please monitor the area we marked with a pen today. Please return if you have any other emergent concerns.  Additional Information:  To find a primary care or specialty doctor please call [phone] or [phone] to access " Find a Doctor Service."  You may also go on the [facility] website at [email or web address]  There are also multiple [facility] practices throughout the [place] that are frequently accepting new patients. You may find a clinic that is close to your home and contact them.  [facility] [address] [phone]  [facility] in [place] (also locations in [place] and [place]) - [address] [place] [phone]  [facility] - [address] [place] [phone]    Your vital signs today were: BP 118/74 (BP Location: Left Arm)    Pulse 76    Temp 98 F (36.7 C) (Oral)    Resp 18    Ht  5\' 2"  (1.575 m)    Wt 77.1 kg (170 lb)    LMP 09/29/2017 (Exact Date)    SpO2 100%    BMI 31.09 kg/m  If your blood pressure (BP) was elevated on multiple readings during this visit above 130 for the top number or above 80 for the bottom number, please have this repeated by your primary care provider within one  month. --------------  Thank you for allowing us to participate in your care today. It was a pleasure taking care of you today!

## 2017-10-20 NOTE — ED Provider Notes (Signed)
Medical screening examination/treatment/procedure(s) were conducted as a shared visit with non-physician practitioner(s) and myself.  I personally evaluated the patient during the encounter.   EKG Interpretation None     41 year old female here with 6-day of a laceration to her right middle finger.  No evidence of tenosynovitis.  Will be given oral antibiotics and return precautions   Lorre NickAllen, Anjanae Woehrle, MD 10/20/17 2019
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
## 2017-10-20 NOTE — ED Notes (Signed)
See provider assessment 
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
## 2022-07-27 ENCOUNTER — Ambulatory Visit (HOSPITAL_COMMUNITY)
Admission: EM | Admit: 2022-07-27 | Discharge: 2022-07-27 | Disposition: A | Discharge status: Home / Self Care | Payer: Medicaid Other | Attending: Emergency Medicine | Admitting: Emergency Medicine

## 2022-07-27 ENCOUNTER — Encounter (HOSPITAL_COMMUNITY): Payer: Self-pay | Admitting: Emergency Medicine

## 2022-07-27 DIAGNOSIS — R109 Unspecified abdominal pain: Secondary | ICD-10-CM | POA: Insufficient documentation

## 2022-07-27 DIAGNOSIS — Z113 Encounter for screening for infections with a predominantly sexual mode of transmission: Secondary | ICD-10-CM | POA: Diagnosis not present

## 2022-07-27 LAB — POCT URINALYSIS DIPSTICK, ED / UC
Bilirubin Urine: NEGATIVE
Glucose, UA: NEGATIVE mg/dL
Hgb urine dipstick: NEGATIVE
Ketones, ur: NEGATIVE mg/dL
Leukocytes,Ua: NEGATIVE
Nitrite: NEGATIVE
Protein, ur: NEGATIVE mg/dL
Specific Gravity, Urine: >=1.03 (ref 1.005–1.030)
Urobilinogen, UA: 0.2 mg/dL (ref 0.0–1.0)
pH: 5 (ref 5.0–8.0)

## 2022-07-27 LAB — HIV ANTIBODY (ROUTINE TESTING W REFLEX): HIV Screen 4th Generation wRfx: NONREACTIVE

## 2022-07-27 LAB — POC URINE PREG, ED: Preg Test, Ur: NEGATIVE
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
## 2022-07-27 NOTE — Discharge Instructions (Addendum)
We will call you if any results return positive.  Please follow up with your ob/gyn if symptoms persist.  Please go to the emergency department if symptoms worsen.

## 2022-07-27 NOTE — ED Triage Notes (Signed)
Patient c/o vaginal discharge and mid ABD pain x 3.5 weeks.   Patient denies fever and emesis.   Patient endorses intermittent dysuria. Patient endorses nausea at times.   Patient endorses "yellow" discharge. Patient endorses abnormal vaginal odor.   Patient wants STD testing and blood work.   Patient hasn't taken any medications for symptoms.

## 2022-07-28 LAB — CERVICOVAGINAL ANCILLARY ONLY
Bacterial Vaginitis (gardnerella): POSITIVE — AB
Candida Glabrata: NEGATIVE
Candida Vaginitis: NEGATIVE
Chlamydia: NEGATIVE
Comment: NEGATIVE
Comment: NEGATIVE
Comment: NEGATIVE
Comment: NEGATIVE
Comment: NEGATIVE
Comment: NORMAL
Neisseria Gonorrhea: NEGATIVE
Trichomonas: POSITIVE — AB

## 2022-07-28 LAB — RPR: RPR Ser Ql: NONREACTIVE

## 2022-07-29 ENCOUNTER — Telehealth (HOSPITAL_COMMUNITY): Payer: Self-pay | Admitting: Emergency Medicine

## 2022-07-29 MED ORDER — METRONIDAZOLE 500 MG PO TABS: 500.0000 mg | ORAL_TABLET | Freq: Two times a day (BID) | ORAL | 0 refills | Status: AC

## 2023-09-26 ENCOUNTER — Encounter (HOSPITAL_COMMUNITY): Payer: Self-pay | Admitting: *Deleted

## 2023-09-26 ENCOUNTER — Ambulatory Visit (HOSPITAL_COMMUNITY)
Admission: EM | Admit: 2023-09-26 | Discharge: 2023-09-26 | Disposition: A | Discharge status: Home / Self Care | Payer: 59 | Attending: Family Medicine | Admitting: Family Medicine

## 2023-09-26 DIAGNOSIS — M654 Radial styloid tenosynovitis [de Quervain]: Secondary | ICD-10-CM

## 2023-09-26 MED ORDER — DICLOFENAC SODIUM 75 MG PO TBEC: 75.0000 mg | DELAYED_RELEASE_TABLET | Freq: Two times a day (BID) | ORAL | 0 refills | Status: AC

## 2023-09-26 MED ORDER — PREDNISONE 20 MG PO TABS: 40.0000 mg | ORAL_TABLET | Freq: Every day | ORAL | 0 refills | Status: AC

## 2023-09-26 NOTE — ED Triage Notes (Signed)
Pt states that this morning she woke up with left thumb pain that radiates up her arm. She states the thumb is swollen. She hasn't taken any meds.
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
